# Patient Record
Sex: Female | Born: 1980 | Race: Asian | Hispanic: No | Marital: Married | Smoking: Never smoker
Health system: Southern US, Community
[De-identification: ages and names within clinical notes are randomized; demographics above are authoritative.]

## PROBLEM LIST (undated history)

## (undated) DIAGNOSIS — E119 Type 2 diabetes mellitus without complications: Secondary | ICD-10-CM

## (undated) HISTORY — DX: Type 2 diabetes mellitus without complications: E11.9

---

## 2006-09-19 ENCOUNTER — Ambulatory Visit: Payer: Self-pay | Admitting: Internal Medicine

## 2007-03-12 ENCOUNTER — Ambulatory Visit: Payer: Self-pay | Admitting: Internal Medicine

## 2007-03-12 LAB — CONVERTED CEMR LAB
Albumin: 4.6 g/dL (ref 3.5–5.2)
BUN: 10 mg/dL (ref 6–23)
Calcium: 9.6 mg/dL (ref 8.4–10.5)
Chlamydia, DNA Probe: NEGATIVE
Chloride: 104 meq/L (ref 96–112)
GC Probe Amp, Genital: NEGATIVE
Glucose, Bld: 95 mg/dL (ref 70–99)
Hgb A: 97.5 % (ref 96.8–97.8)
Lymphocytes Relative: 35 % (ref 12–46)
Lymphs Abs: 1.8 10*3/uL (ref 0.7–4.0)
Monocytes Relative: 6 % (ref 3–12)
Neutro Abs: 2.8 10*3/uL (ref 1.7–7.7)
Neutrophils Relative %: 55 % (ref 43–77)
Potassium: 4.3 meq/L (ref 3.5–5.3)
Progesterone: 1 ng/mL
RBC: 4.8 M/uL (ref 3.87–5.11)
Sodium: 139 meq/L (ref 135–145)
Total Protein: 7.7 g/dL (ref 6.0–8.3)
WBC: 5.1 10*3/uL (ref 4.0–10.5)

## 2007-03-13 ENCOUNTER — Ambulatory Visit: Payer: Self-pay | Admitting: *Deleted

## 2007-03-17 ENCOUNTER — Ambulatory Visit: Payer: Self-pay | Admitting: Internal Medicine

## 2007-03-17 LAB — CONVERTED CEMR LAB: Preg, Serum: NEGATIVE

## 2007-04-11 ENCOUNTER — Ambulatory Visit: Payer: Self-pay | Admitting: Internal Medicine

## 2007-09-30 ENCOUNTER — Ambulatory Visit: Payer: Self-pay | Admitting: Advanced Practice Midwife

## 2007-09-30 ENCOUNTER — Emergency Department (HOSPITAL_COMMUNITY): Admission: EM | Admit: 2007-09-30 | Discharge: 2007-09-30 | Payer: Self-pay | Admitting: Family Medicine

## 2007-09-30 ENCOUNTER — Inpatient Hospital Stay (HOSPITAL_COMMUNITY): Admission: AD | Admit: 2007-09-30 | Discharge: 2007-09-30 | Payer: Self-pay | Admitting: Family Medicine

## 2007-10-03 ENCOUNTER — Ambulatory Visit: Payer: Self-pay | Admitting: Family Medicine

## 2007-10-03 ENCOUNTER — Inpatient Hospital Stay (HOSPITAL_COMMUNITY): Admission: AD | Admit: 2007-10-03 | Discharge: 2007-10-13 | Payer: Self-pay | Admitting: Obstetrics and Gynecology

## 2007-10-12 ENCOUNTER — Encounter: Payer: Self-pay | Admitting: Family Medicine

## 2007-10-24 ENCOUNTER — Emergency Department (HOSPITAL_COMMUNITY): Admission: EM | Admit: 2007-10-24 | Discharge: 2007-10-24 | Payer: Self-pay | Admitting: Family Medicine

## 2007-10-25 ENCOUNTER — Ambulatory Visit: Payer: Self-pay | Admitting: Obstetrics & Gynecology

## 2007-10-25 ENCOUNTER — Inpatient Hospital Stay (HOSPITAL_COMMUNITY): Admission: AD | Admit: 2007-10-25 | Discharge: 2007-10-25 | Payer: Self-pay | Admitting: Family Medicine

## 2007-11-07 ENCOUNTER — Ambulatory Visit: Payer: Self-pay | Admitting: Obstetrics & Gynecology

## 2007-11-17 ENCOUNTER — Ambulatory Visit (HOSPITAL_COMMUNITY): Admission: RE | Admit: 2007-11-17 | Discharge: 2007-11-17 | Payer: Self-pay | Admitting: Family Medicine

## 2007-11-29 ENCOUNTER — Emergency Department (HOSPITAL_COMMUNITY): Admission: EM | Admit: 2007-11-29 | Discharge: 2007-11-29 | Payer: Self-pay | Admitting: Emergency Medicine

## 2007-12-31 ENCOUNTER — Encounter: Payer: Self-pay | Admitting: Family Medicine

## 2007-12-31 ENCOUNTER — Ambulatory Visit: Payer: Self-pay | Admitting: Family Medicine

## 2008-02-18 ENCOUNTER — Encounter (INDEPENDENT_AMBULATORY_CARE_PROVIDER_SITE_OTHER): Payer: Self-pay | Admitting: Internal Medicine

## 2008-02-18 ENCOUNTER — Ambulatory Visit: Payer: Self-pay | Admitting: Family Medicine

## 2008-02-18 LAB — CONVERTED CEMR LAB
Ketones, ur: NEGATIVE mg/dL
Nitrite: NEGATIVE
Specific Gravity, Urine: 1.013 (ref 1.005–1.03)
Urobilinogen, UA: 0.2 (ref 0.0–1.0)
pH: 5.5 (ref 5.0–8.0)

## 2008-04-26 ENCOUNTER — Encounter: Payer: Self-pay | Admitting: Family Medicine

## 2008-04-26 ENCOUNTER — Ambulatory Visit: Payer: Self-pay | Admitting: Internal Medicine

## 2010-03-13 ENCOUNTER — Encounter: Payer: Self-pay | Admitting: Emergency Medicine

## 2010-03-21 IMAGING — US US OB LIMITED
1 series · 5 of 5 positions shown · non-contrast
Comparison: none

OBSTETRICAL ULTRASOUND:
 This ultrasound exam was performed in the [HOSPITAL] Ultrasound Department.  The OB US report was generated in the AS system, and faxed to the ordering physician.  This report is also available in [REDACTED] PACS.

[Series 1: us ob limited · 0.30mm/px · 5 of 5 slices shown]
[im 1/5]
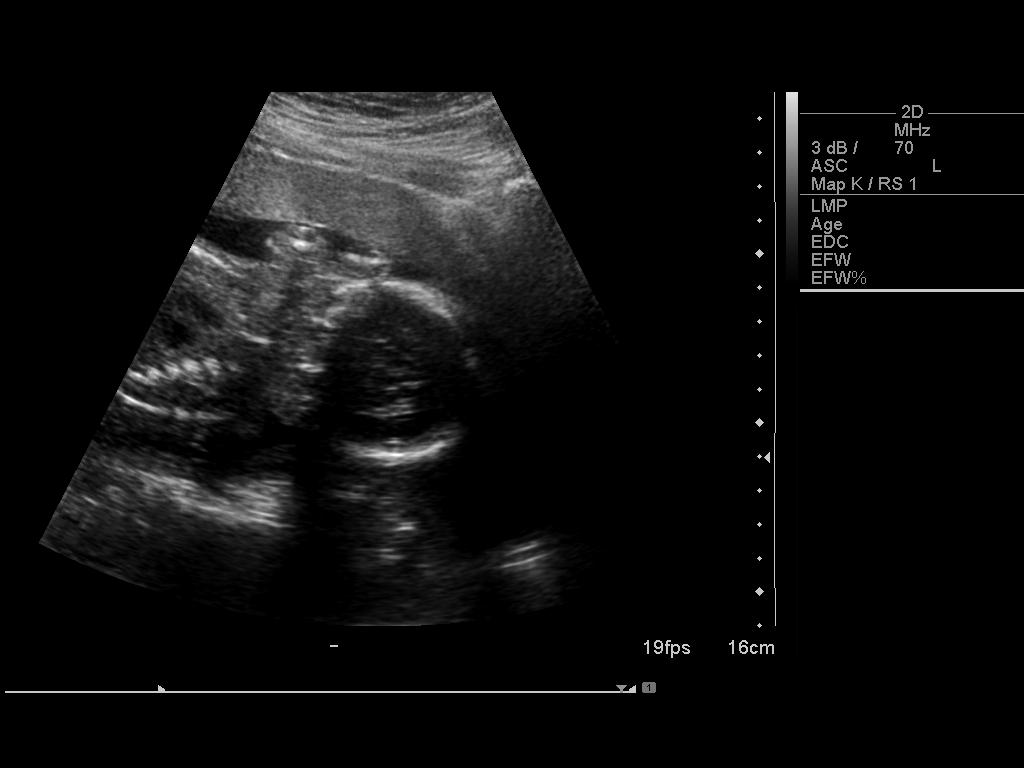
[im 2/5]
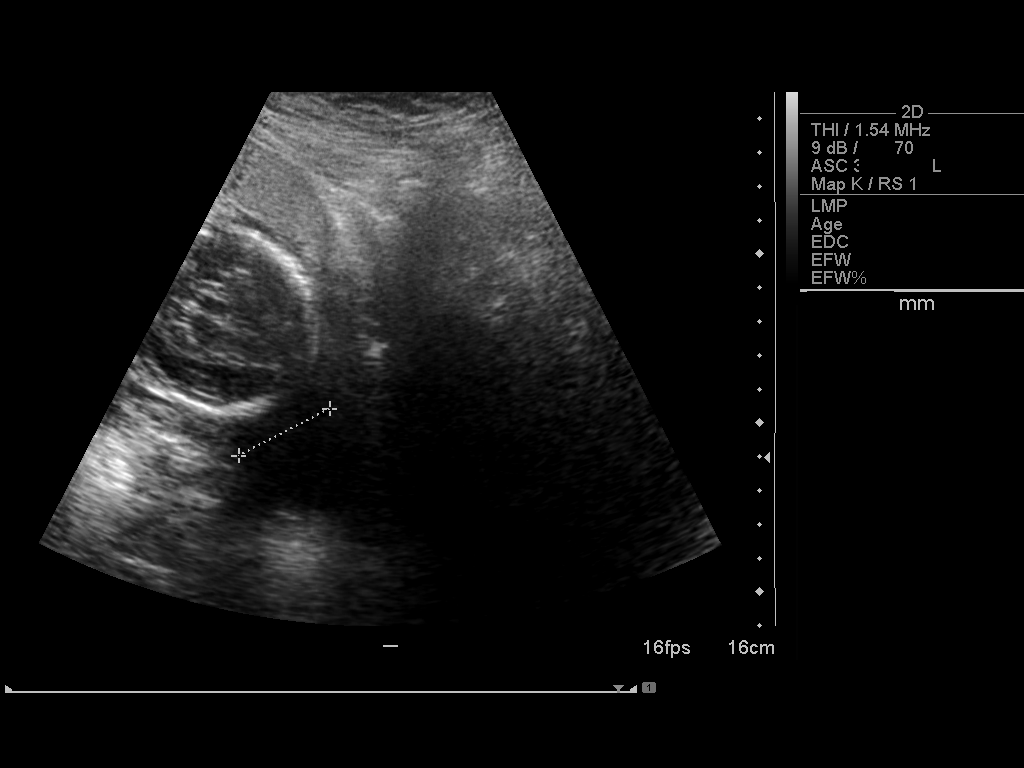
[im 3/5]
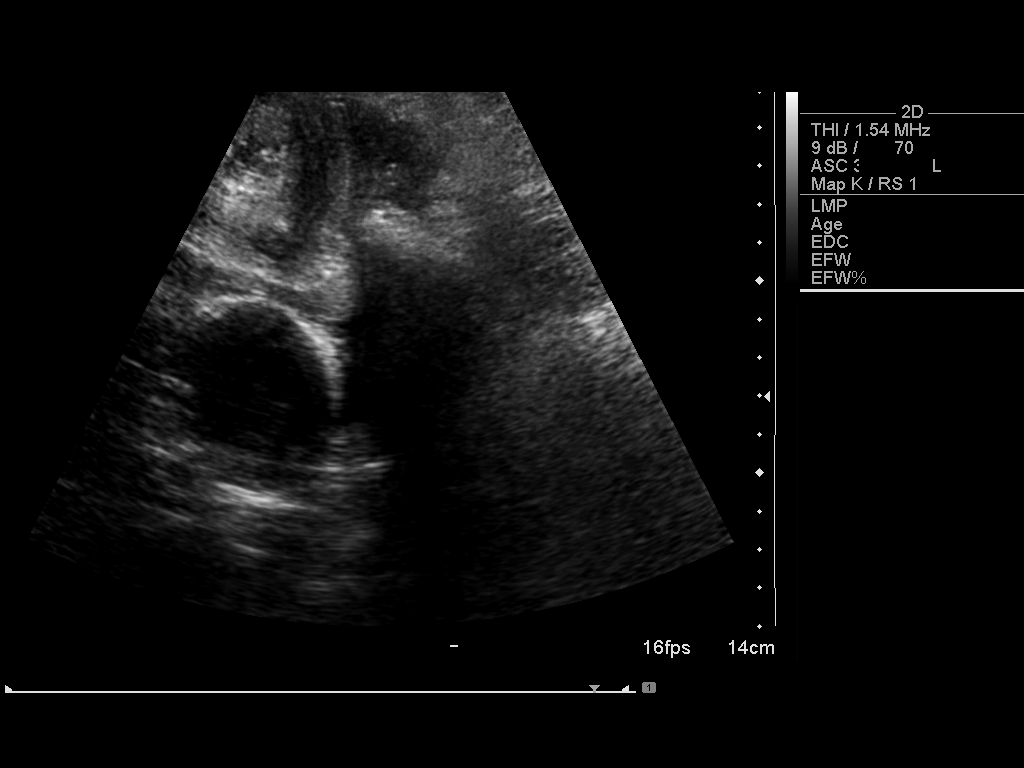
[im 4/5]
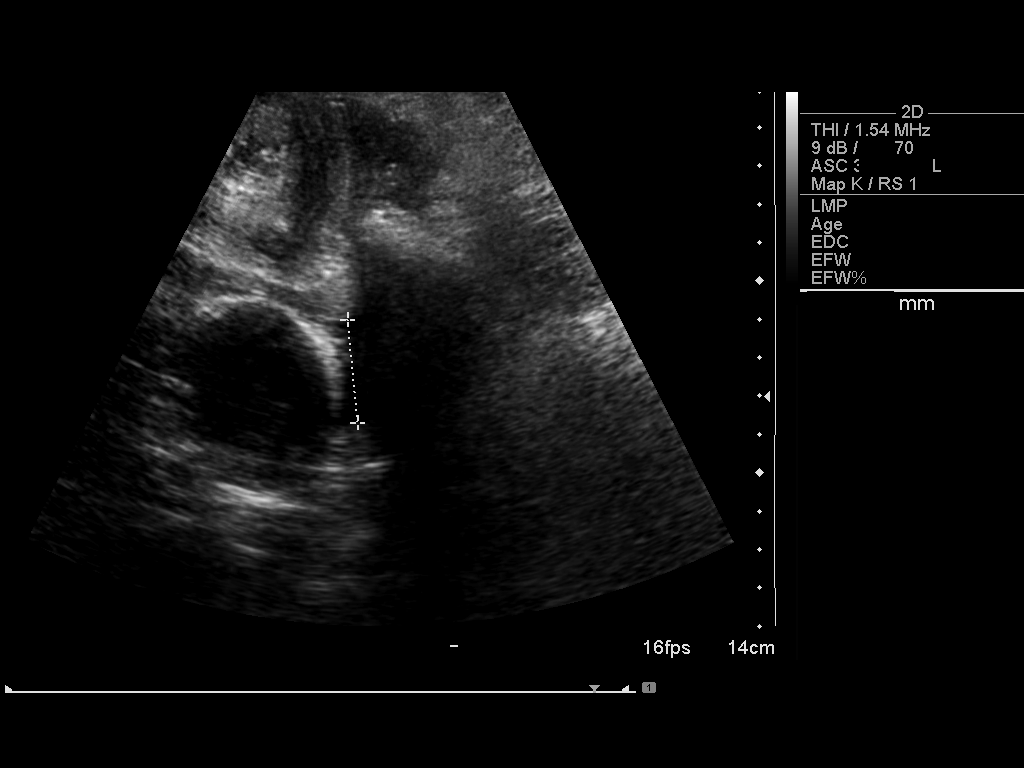
[im 5/5]
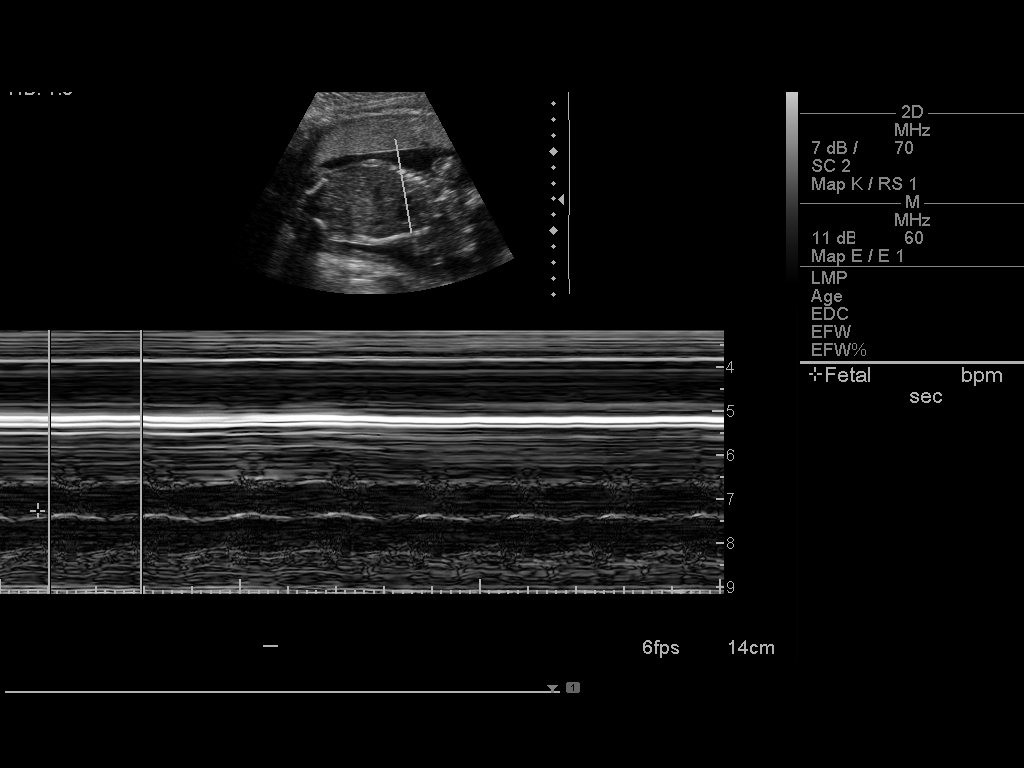

[5 of 5 positions shown; findings below may reference images not displayed]

IMPRESSION: See AS Obstetric US report.

## 2010-04-09 IMAGING — US US TRANSVAGINAL NON-OB
1 series · 14 of 25 positions shown · non-contrast
Comparison: This study is correlated with the transabdominal scan
performed earlier today.

CLINICAL DATA: Pelvic pain.  The patient returned for endovaginal
imaging after the transabdominal scan.

TRANSVAGINAL ULTRASOUND OF PELVIS
TECHNIQUE: Transvaginal ultrasound examination of the pelvis was
performed including evaluation of the uterus, ovaries, adnexal
regions, and pelvic cul-de-sac.

[Series 1: unknown · 0.32mm/px · 14 of 38 slices shown]
[im 1/38]
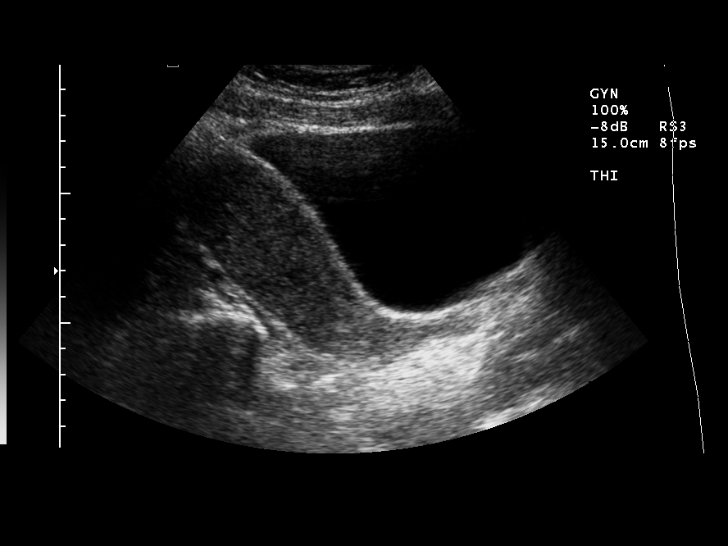
[im 4/38]
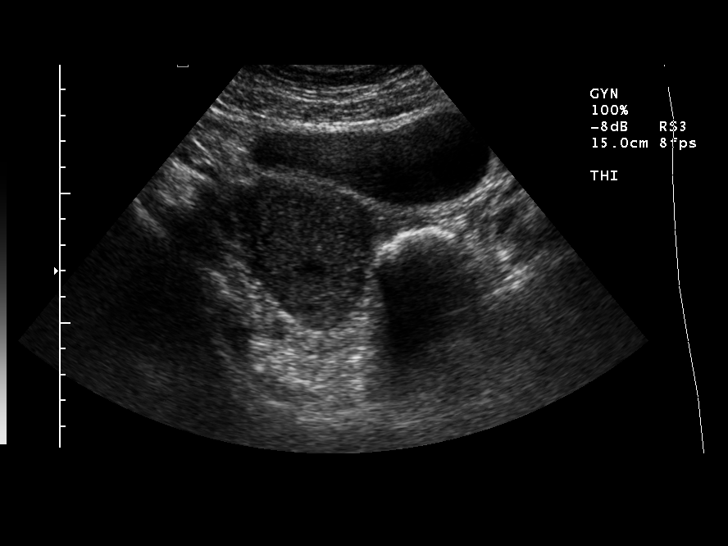
[im 7/38]
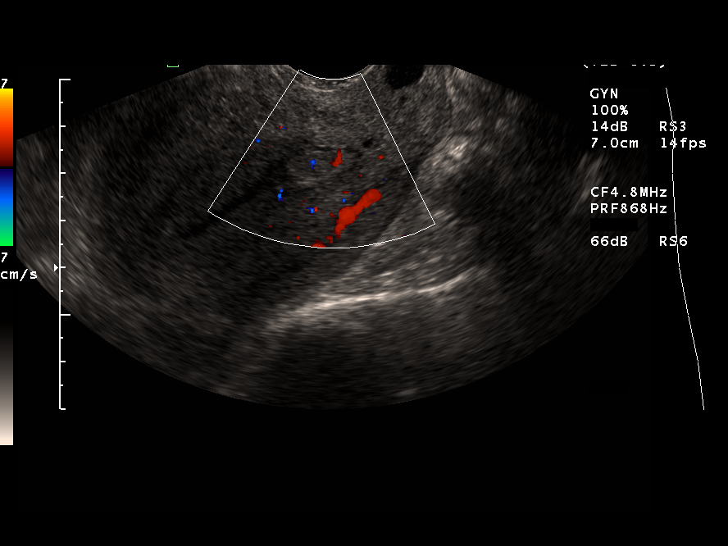
[im 10/38]
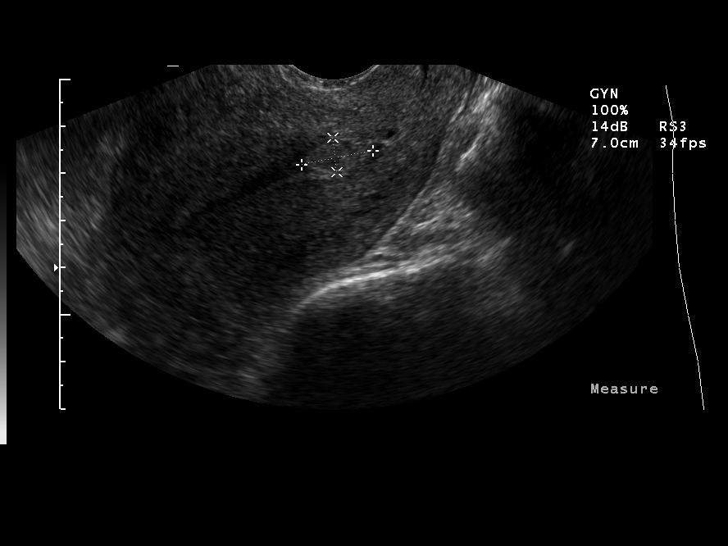
[im 13/38]
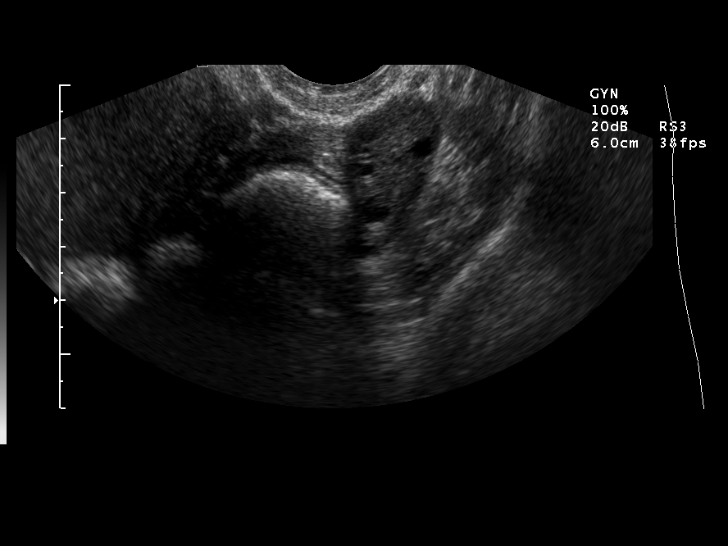
[im 14/38]
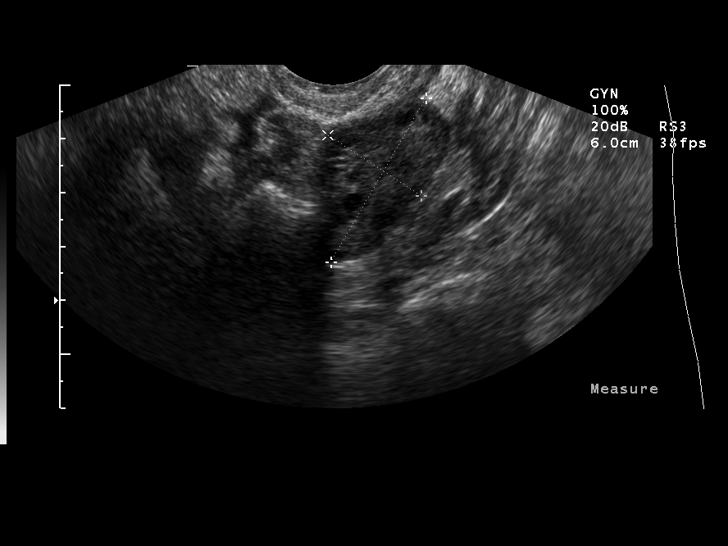
[im 17/38]
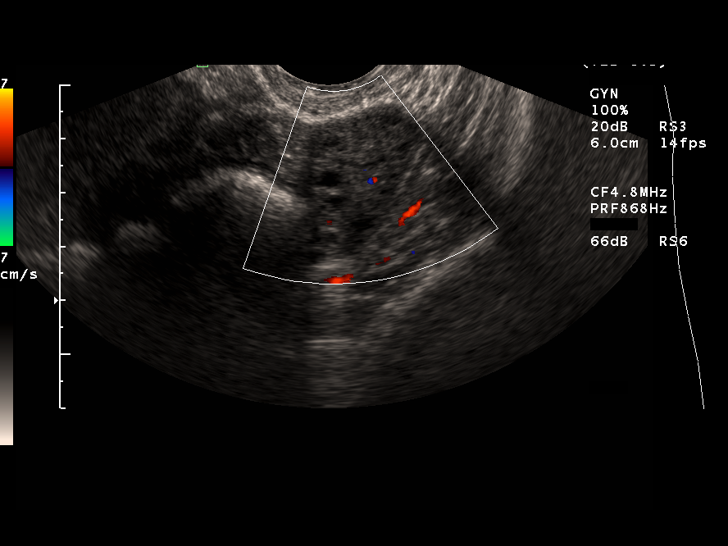
[im 21/38]
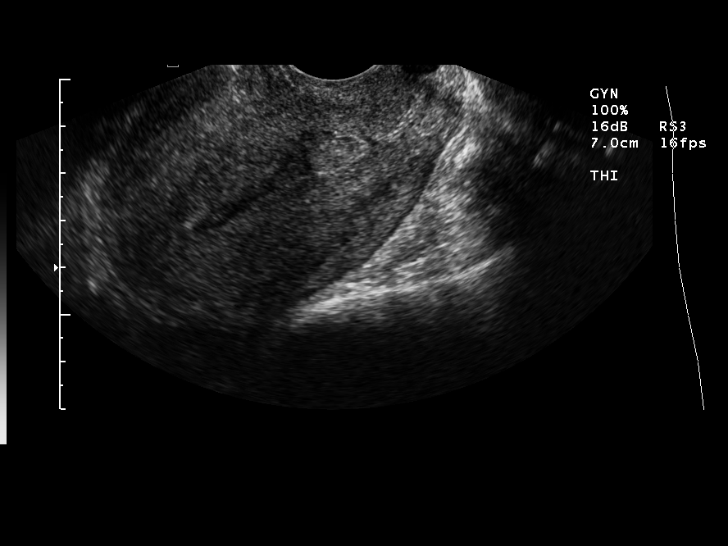
[im 24/38]
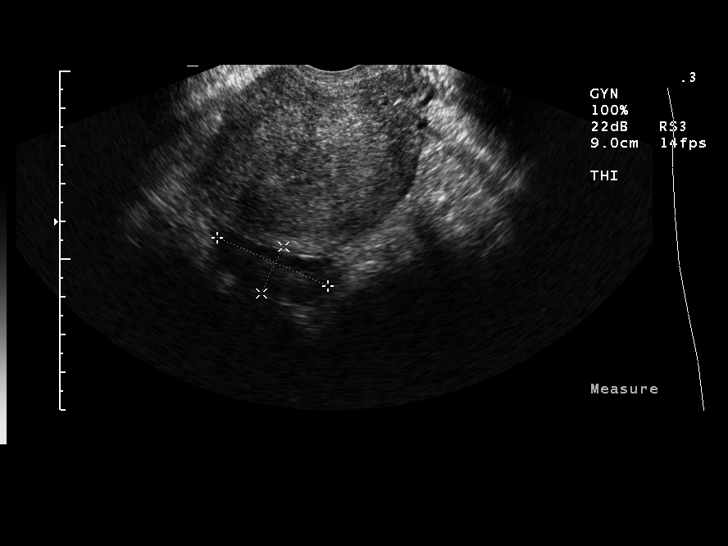
[im 25/38]
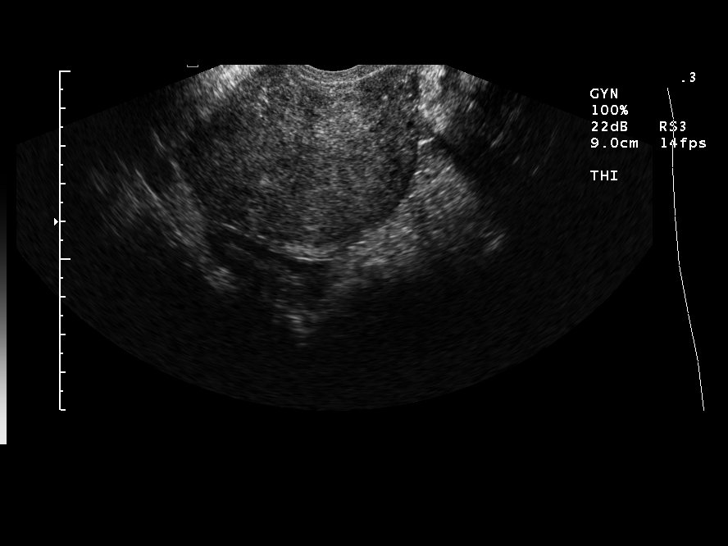
[im 28/38]
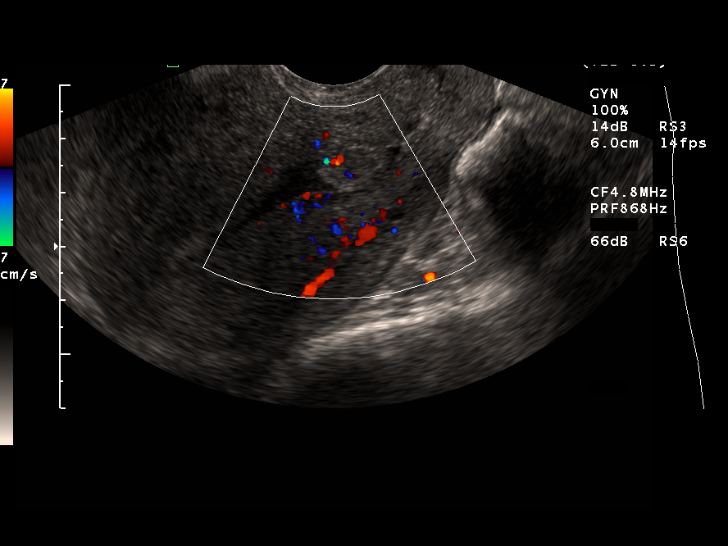
[im 31/38]
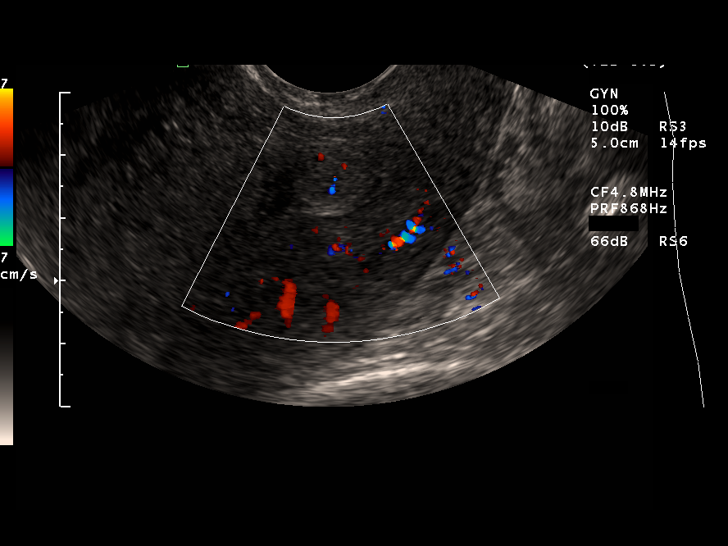
[im 34/38]
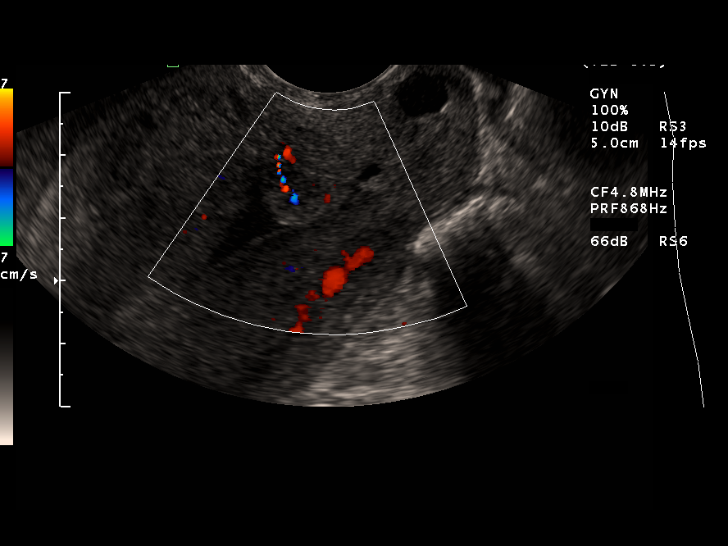
[im 38/38]
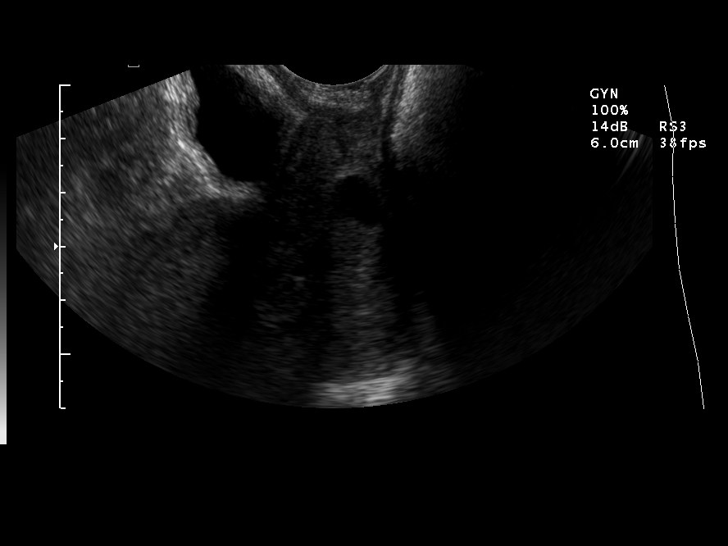

[14 of 25 positions shown; findings below may reference images not displayed]

FINDINGS: On transabdominal imaging, within the endometrial canal,
there is a 7 x 15 x 11 mm echogenic structure in the lower uterine
segment.  Color flow imaging demonstrates positive vascular flow
centrally within the small mass.  These findings are worrisome for
retained products of conception given recent postpartum state.
There is fluid in the mid and upper uterine endometrium and it is
likely that this small mass is causing some obstruction of flow of
fluid out of the uterus.
IMPRESSION: Findings are worrisome for retained products of conception in the
lower uterine segment as described. Critical test results
telephoned to Dr. Oayala at the time of interpretation on date
10/25/2007 at time 6046 hours.

## 2010-04-09 IMAGING — US US PELVIS COMPLETE
1 series · 14 of 25 positions shown · non-contrast
Comparison: 10/08/2007

CLINICAL DATA: Abdominal pain.  2 weeks postpartum.

TRANSABDOMINAL ULTRASOUND OF PELVIS
TECHNIQUE: Transabdominal ultrasound examination of the pelvis was
performed including evaluation of the uterus, ovaries, adnexal
regions, and pelvic cul-de-sac.

[Series 1: unknown · 0.28mm/px · 14 of 32 slices shown]
[im 1/32]
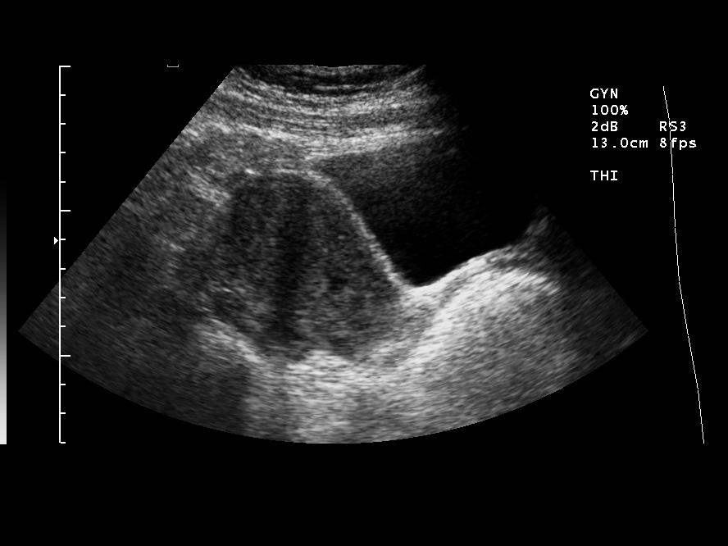
[im 3/32]
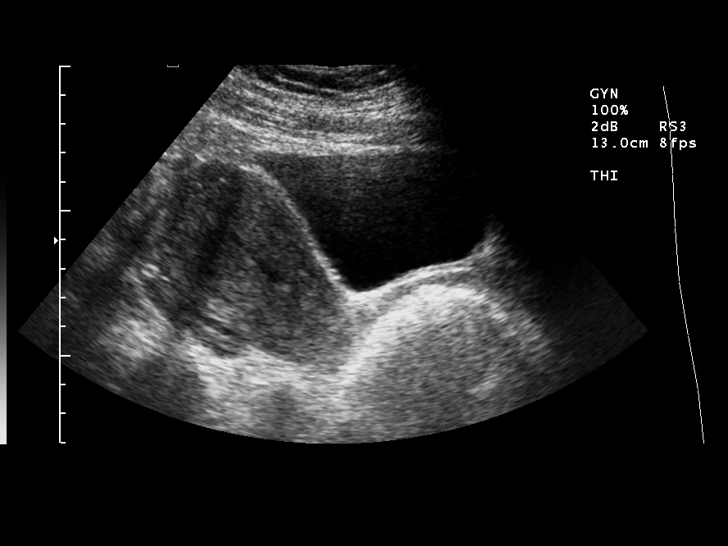
[im 6/32]
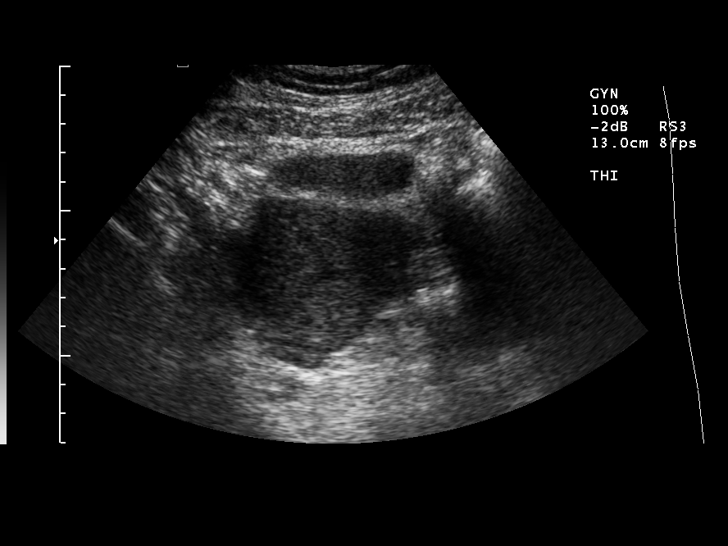
[im 8/32]
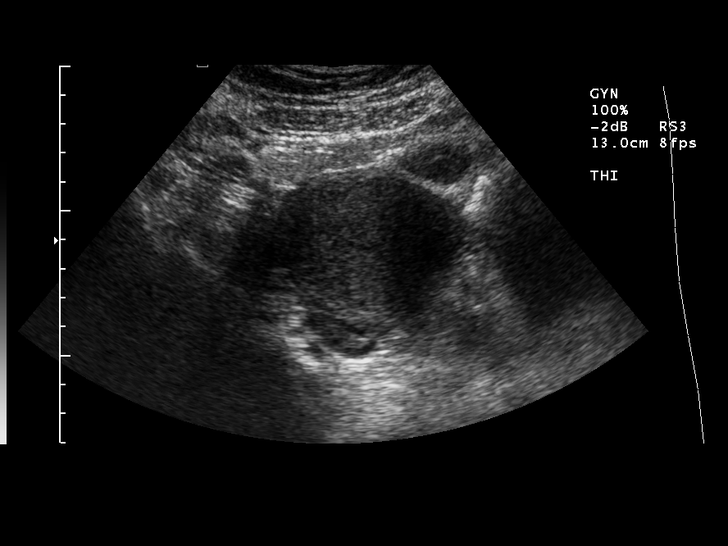
[im 11/32]
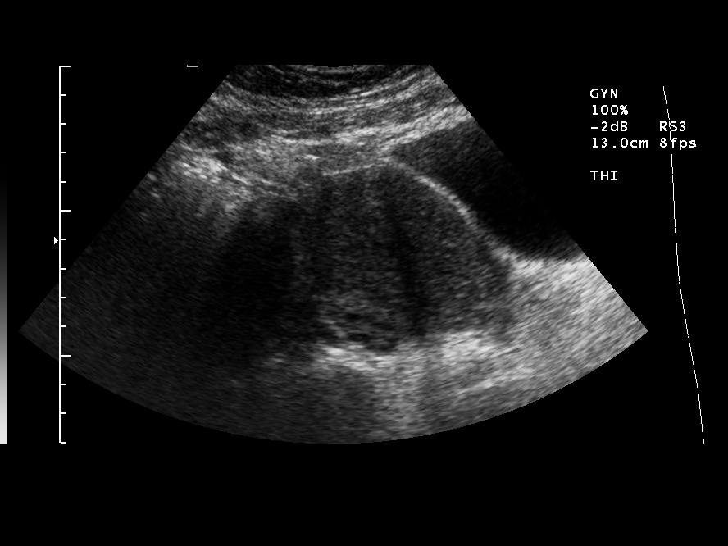
[im 12/32]
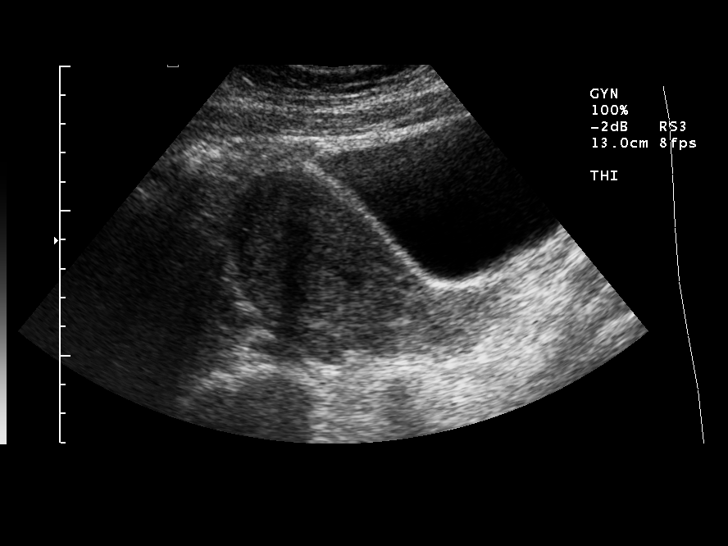
[im 15/32]
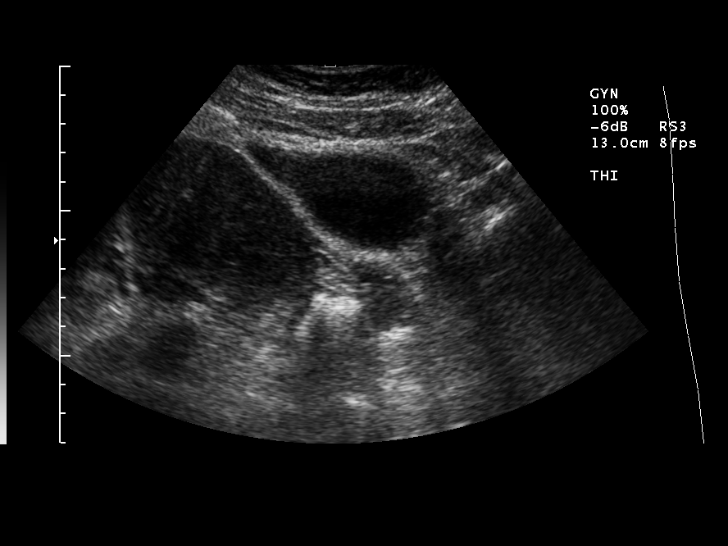
[im 17/32]
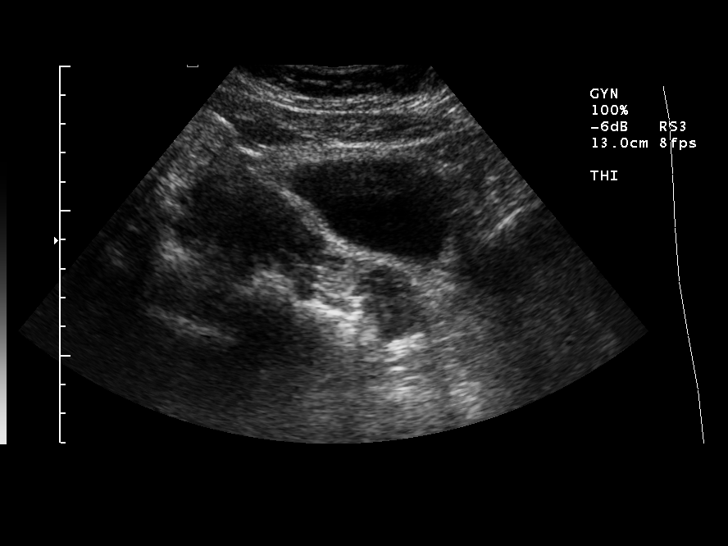
[im 20/32]
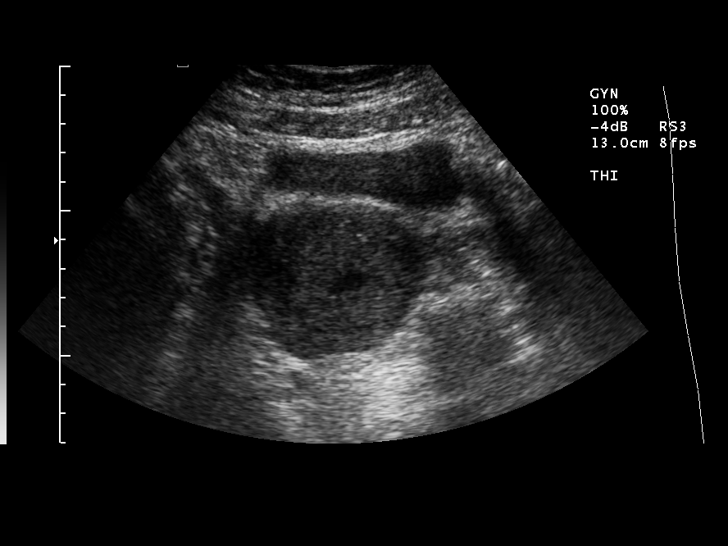
[im 21/32]
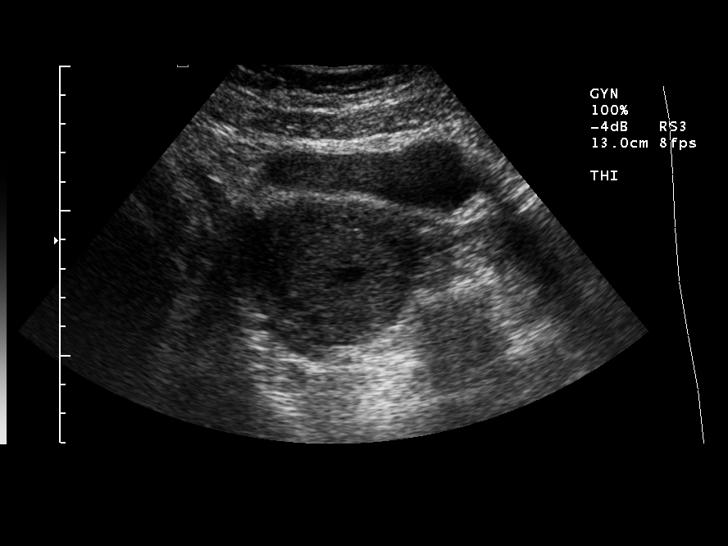
[im 24/32]
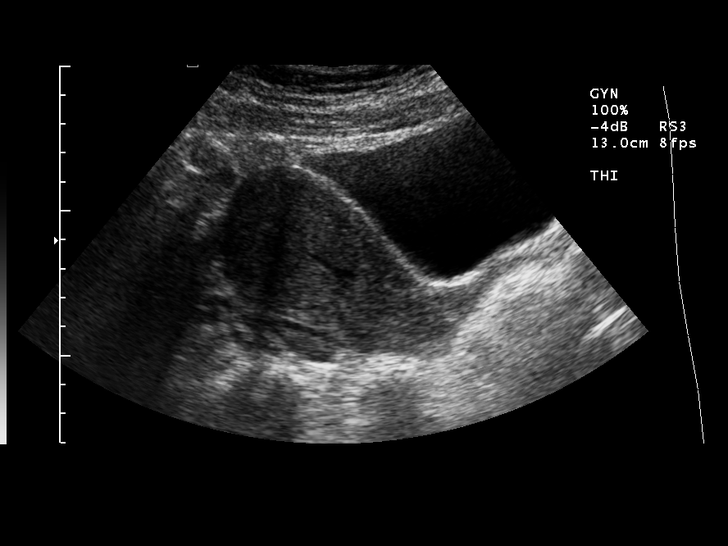
[im 26/32]
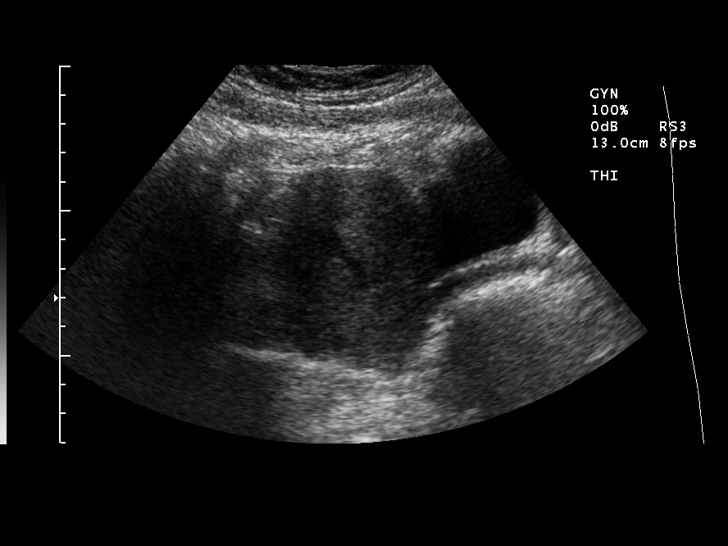
[im 29/32]
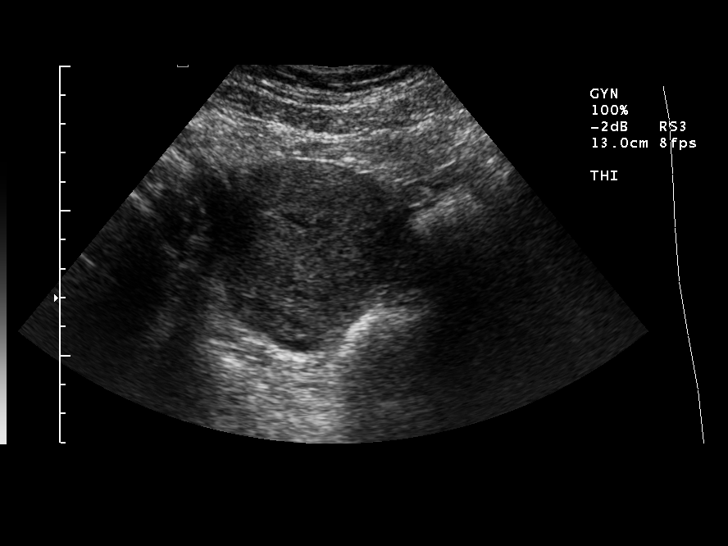
[im 32/32]
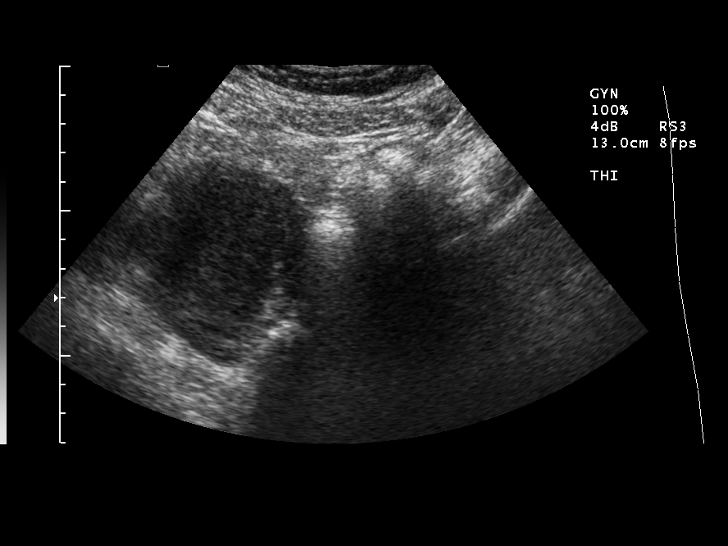

[14 of 25 positions shown; findings below may reference images not displayed]

FINDINGS: The patient refused endovaginal imaging.  This does
severely limit the exam.

The uterus is 7.4 x 4.7 x 5.1 cm.  The endometrial region is 7 mm
in thickness and primarily anechoic most consistent with fluid.
There is no obvious endometrial mass.  Ovaries are within normal
limits.  No free fluid.
IMPRESSION: A small amount of fluid is noted in the endometrium.  Otherwise no
acute abnormality.  The patient refused transvaginal sonography and
this limits the exam

## 2010-07-04 NOTE — Group Therapy Note (Signed)
Brianna Avery NO.:  [id number]   MEDICAL RECORD NO.:  [id number]          PATIENT TYPE:  WOC   LOCATION:  [facility]                   FACILITY:  [facility]   PHYSICIAN:  Elsie Lincoln, MD      DATE OF BIRTH:  1980/12/03   DATE OF SERVICE:                                  CLINIC NOTE   TOTAL TIME SPENT WITH THE PATIENT:  One hour.   Patient is a 30 year old female who was admitted to our service in  August 2009.  She had symptoms of an incompetent cervix and had her  membranes dumbelling into the vagina for almost a week.  She had very  good dating for her pregnancy.  She eventually developed  chorioamnionitis and delivered.  It was a very interesting delivery in  that she would not open her legs and the baby stayed in the vagina for  up to 2 hours.  The patient has not coped very well.  She seems sad and  very tense and the trauma has I think caused her to have vaginosis  because putting a speculum is impossible and you can barely admit 1  finger into her vagina.  MFM was involved with her care with consults  and I think they agree with the diagnosis of incompetent cervix;  however, they would like to see and MFM prior to becoming regnant again  and we have referred them.  We will also need to refer them to the  [facility].  The patient did come to the emergency  room with some pain and had an ultrasound that showed 15 mm of stripe in  the lower uterine segment that was questionably retained products of  conception.  She was given Methergine and today her urine pregnancy test  is negative and there is no bleeding.  I am assuming that everything has  passed and I explained this to the patient.   PHYSICAL EXAM:  Temperature 98.  Pulse 100.  Blood pressure 102/71.  Weight 160.7.  Height 5 feet 4 inches.  GENERAL:  Well nourished, well developed, seems sad.  GENITALIA:  Tanner 5.  Unable to do a speculum exam.  Very tense pelvic  diaphragm  and spasming of the levators as she tries to allow me to do  the exam.  Her cervix is closed.  Her uterus is nontender and normal  size.  Adnexa no masses, nontender.   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  1. MFM consult.  2. Kegel exercises to help control some of the spasming of her pelvic      diaphragm.  3. EPT today is negative.  4. Patient can start to conceive after another cycle.  5. Call us p.r.n.           ______________________________  Elsie Lincoln, MD     KL/MEDQ  D:  11/07/2007  T:  11/10/2007  Job:  [id number]

## 2010-07-04 NOTE — Discharge Summary (Signed)
NAMEKARIANN, WECKER NO.:  [id number]   MEDICAL RECORD NO.:  [id number]          PATIENT TYPE:  INP   LOCATION:  9304                          [facility]   PHYSICIAN:  Tilda Burrow, M.D. DATE OF BIRTH:  [birth year]   DATE OF ADMISSION:  10/03/2007  DATE OF DISCHARGE:  10/13/2007                               DISCHARGE SUMMARY   ADMITTING DIAGNOSES:  1. Pregnancy, 22 weeks' gestation.  2. Premature cervical dilation, suspected incompetent cervix,      threatened miscarriage.   HISTORY OF PRESENT ILLNESS:  This is a 30 year old female gravida 1,  para 0-0-0-0 is admitted after a pregnancy course, initially started at  the clinic in [place], with recent initiation of care here in the [place].  Gestational age is based on ultrasound obtained from early in  the pregnancy by her [place] physicians.  She is 22 weeks by best  criteria.  She was presented here complaining of mild-to-moderate  intensity cramping located in lower abdomen, pelvis, and groin.  She has  had passed a bloody mucus-tinged mucus vaginally and she had not seen  anybody here in the [place] for prenatal care.  She had been  evaluated at [facility] on September 30, 2007,  and diagnosed of having urinary tract infection and treated accordingly.  Cervix was described as long and thick at the time of that evaluation.  Urine culture came back positive for group B strep.  Examination on time  of admission, October 03, 2007, showed the cervix to be 3.5 cm, 50% with  bulging allograft membranes into the introitus.  The patient requested  that no female providers to be involved in her care and those requests  were honored.  The patient was, therefore, admitted for management of  the cervical incompetency.  Sterile speculum exam on October 04, 2007, by  Dr. Penne Lash showed bag of waters bulging 2-3 cm from the cervical os  with pooling and amniotic fluids suspected.   Ferning was negative at the  time of that exam.  The patient selected conservative care.  She was  made aware of the risk of infection and potential jeopardy to her  health.  Subsequent questions on the patient regarding gestational age  were addressed by review of her old ultrasound.  On October 06, 2007, she  had exquisite left lower quadrant tenderness, but still desired  conservative management.  White count was 12,500 and she was on  antibiotic prophylaxis.  She had increased leakage of fluid on October 06, 2007, to clarify whether membranes were ruptured.  The ultrasound  showed oligohydramnios with the cervical head at the os and allografting  membranes below well into the vaginal vault.  The patient still offered  augmentation, though she was offered augmentation of labor to effect  delivery.  Extreme prematurity and the nonviability of the infant was  discussed at length with the patient.  Pediatric counseling regarding  the nonviability of the premature infant was performed.  On October 07, 2007, the patient experienced continued cramping of rectal pressure.  She initially agreed to low-dose Pitocin to evacuate the uterus and then  changed her mind.  She desired no further intervention and was  transferred to the floor for observation.  Consideration was given  sending the patient home until viable gestational age and the patient  insisted on being managed in the hospital.  Repeat conversations were  held on October 07, 2007, regarding the patient's uncertainty regarding  the dates.  We have again re-iterated our available information and the  minimal likelihood of the fetal viability at this time.  On October 08, 2007, October 09, 2007, and October 10, 2007, things were made positive  with no significant change in her status.  White count remained stable  at 13,800.  The patient was afebrile.  On October 11, 2007, she began to  experience an increased cramping and foul-smelling  discharge.  Vaginal  exam showed small parts in the vagina with the diagnosis of  chorioamnionitis confirmed along with the preterm prolonged rupture of  membranes.  Maternal Fetal Medicine consult to confirm that all avenues  of support had been pursued, was performed and documented.  On October 11, 2007, at 23:50, the patient was informed that the head had made  progress for delivery.  On October 12, 2007, the patient suddenly felt  baby coming out with feet to the introitus.  She delivered over 8 pounds  viable female infant with malodor indicative of infection.  The infant  was alive at birth but expired thereafter.  The EBL was 150 mL.  The  placenta delivered spontaneously.  Postpartum course was uneventful.  Blood type was confirmed as B positive.  She was then discharged home on  October 13, 2007, in stable condition for postpartum care through the  health department.      Tilda Burrow, M.D.  Electronically Signed     JVF/MEDQ  D:  11/02/2007  T:  11/03/2007  Job:  [id number]

## 2010-11-17 LAB — CBC
MCV: 87.7
Platelets: 284
RBC: 3.96
WBC: 13.5 — ABNORMAL HIGH

## 2010-11-17 LAB — WET PREP, GENITAL
Clue Cells Wet Prep HPF POC: NONE SEEN
Trich, Wet Prep: NONE SEEN
Yeast Wet Prep HPF POC: NONE SEEN

## 2010-11-17 LAB — URINALYSIS, ROUTINE W REFLEX MICROSCOPIC
Bilirubin Urine: NEGATIVE
Glucose, UA: NEGATIVE
Hgb urine dipstick: NEGATIVE
Ketones, ur: NEGATIVE
Protein, ur: NEGATIVE
pH: 6

## 2010-11-17 LAB — URINE CULTURE

## 2010-11-17 LAB — DIFFERENTIAL
Eosinophils Absolute: 0.1
Lymphs Abs: 2
Monocytes Relative: 5
Neutro Abs: 10.8 — ABNORMAL HIGH
Neutrophils Relative %: 80 — ABNORMAL HIGH

## 2010-11-17 LAB — POCT URINALYSIS DIP (DEVICE)
Bilirubin Urine: NEGATIVE
Glucose, UA: NEGATIVE
Hgb urine dipstick: NEGATIVE
Nitrite: NEGATIVE
Operator id: 29721
Urobilinogen, UA: 0.2

## 2010-11-17 LAB — RPR: RPR Ser Ql: NONREACTIVE

## 2010-11-17 LAB — RUBELLA SCREEN: Rubella: 235.8 — ABNORMAL HIGH

## 2010-11-17 LAB — HEPATITIS B SURFACE ANTIGEN: Hepatitis B Surface Ag: NEGATIVE

## 2010-11-20 LAB — POCT PREGNANCY, URINE: Preg Test, Ur: NEGATIVE

## 2010-11-21 LAB — POCT URINALYSIS DIP (DEVICE)
Bilirubin Urine: NEGATIVE
Glucose, UA: NEGATIVE
Hgb urine dipstick: NEGATIVE
Ketones, ur: NEGATIVE
Nitrite: NEGATIVE

## 2010-11-21 LAB — POCT PREGNANCY, URINE: Preg Test, Ur: NEGATIVE

## 2010-11-22 LAB — COMPREHENSIVE METABOLIC PANEL
AST: 40 — ABNORMAL HIGH
Albumin: 3.6
CO2: 26
Calcium: 9.7
Creatinine, Ser: 0.71
GFR calc Af Amer: >60
GFR calc non Af Amer: >60
Total Protein: 7.8

## 2010-11-22 LAB — DIFFERENTIAL
Basophils Relative: 1
Eosinophils Absolute: 0
Lymphs Abs: 2
Monocytes Absolute: 0.4
Monocytes Relative: 4
Neutro Abs: 5.8
Neutrophils Relative %: 70

## 2010-11-22 LAB — URINE CULTURE: Colony Count: 75000

## 2010-11-22 LAB — CBC
MCHC: 33.3
MCV: 86.5
Platelets: 336
RDW: 12.8

## 2010-11-22 LAB — POCT URINALYSIS DIP (DEVICE)
Bilirubin Urine: NEGATIVE
Glucose, UA: NEGATIVE
Ketones, ur: NEGATIVE
Nitrite: NEGATIVE

## 2010-11-22 LAB — POCT PREGNANCY, URINE: Preg Test, Ur: NEGATIVE

## 2010-11-22 LAB — URINALYSIS, ROUTINE W REFLEX MICROSCOPIC
Bilirubin Urine: NEGATIVE
Glucose, UA: NEGATIVE
Hgb urine dipstick: NEGATIVE
Specific Gravity, Urine: 1.009

## 2010-11-22 LAB — PROTIME-INR: INR: 1

## 2010-11-22 LAB — GC/CHLAMYDIA PROBE AMP, GENITAL: GC Probe Amp, Genital: NEGATIVE

## 2010-11-22 LAB — APTT: aPTT: 27

## 2010-11-22 LAB — LIPASE, BLOOD: Lipase: 31

## 2016-02-15 ENCOUNTER — Emergency Department (HOSPITAL_BASED_OUTPATIENT_CLINIC_OR_DEPARTMENT_OTHER)
Admission: EM | Admit: 2016-02-15 | Discharge: 2016-02-15 | Disposition: A | Discharge status: Home / Self Care | Payer: Medicaid Other | Attending: Dermatology | Admitting: Dermatology

## 2016-02-15 ENCOUNTER — Encounter (HOSPITAL_BASED_OUTPATIENT_CLINIC_OR_DEPARTMENT_OTHER): Payer: Self-pay | Admitting: *Deleted

## 2016-02-15 DIAGNOSIS — J029 Acute pharyngitis, unspecified: Secondary | ICD-10-CM | POA: Diagnosis not present

## 2016-02-15 DIAGNOSIS — Z5321 Procedure and treatment not carried out due to patient leaving prior to being seen by health care provider: Secondary | ICD-10-CM | POA: Insufficient documentation

## 2016-02-15 NOTE — ED Triage Notes (Signed)
Pt c/o sore throat and h/a x 1 day , kids at home with strep

## 2016-02-15 NOTE — ED Notes (Signed)
Patient stated that he had to leave unless we could take him and his wife back immediately. Informed him that we would get them to a room as soon as possible, but could not take them back immediately. They stated that they may come back later tonight. Patient alert and orient VSS. 

## 2022-11-02 ENCOUNTER — Encounter (HOSPITAL_BASED_OUTPATIENT_CLINIC_OR_DEPARTMENT_OTHER): Payer: Self-pay | Admitting: Emergency Medicine

## 2022-11-02 ENCOUNTER — Emergency Department (HOSPITAL_BASED_OUTPATIENT_CLINIC_OR_DEPARTMENT_OTHER)
Admission: EM | Admit: 2022-11-02 | Discharge: 2022-11-02 | Discharge status: Against Medical Advice | Payer: Medicaid Other | Attending: Emergency Medicine | Admitting: Emergency Medicine

## 2022-11-02 ENCOUNTER — Emergency Department (HOSPITAL_BASED_OUTPATIENT_CLINIC_OR_DEPARTMENT_OTHER): Payer: Medicaid Other

## 2022-11-02 ENCOUNTER — Other Ambulatory Visit: Payer: Self-pay

## 2022-11-02 DIAGNOSIS — R0602 Shortness of breath: Secondary | ICD-10-CM | POA: Insufficient documentation

## 2022-11-02 DIAGNOSIS — R000 Tachycardia, unspecified: Secondary | ICD-10-CM | POA: Diagnosis not present

## 2022-11-02 DIAGNOSIS — R197 Diarrhea, unspecified: Secondary | ICD-10-CM | POA: Diagnosis not present

## 2022-11-02 DIAGNOSIS — Z20822 Contact with and (suspected) exposure to covid-19: Secondary | ICD-10-CM | POA: Insufficient documentation

## 2022-11-02 DIAGNOSIS — E876 Hypokalemia: Secondary | ICD-10-CM | POA: Insufficient documentation

## 2022-11-02 DIAGNOSIS — R7401 Elevation of levels of liver transaminase levels: Secondary | ICD-10-CM | POA: Diagnosis not present

## 2022-11-02 DIAGNOSIS — R519 Headache, unspecified: Secondary | ICD-10-CM | POA: Insufficient documentation

## 2022-11-02 DIAGNOSIS — Z5329 Procedure and treatment not carried out because of patient's decision for other reasons: Secondary | ICD-10-CM | POA: Diagnosis not present

## 2022-11-02 DIAGNOSIS — R509 Fever, unspecified: Secondary | ICD-10-CM | POA: Diagnosis present

## 2022-11-02 DIAGNOSIS — E1165 Type 2 diabetes mellitus with hyperglycemia: Secondary | ICD-10-CM | POA: Diagnosis not present

## 2022-11-02 DIAGNOSIS — Z7984 Long term (current) use of oral hypoglycemic drugs: Secondary | ICD-10-CM | POA: Diagnosis not present

## 2022-11-02 DIAGNOSIS — E871 Hypo-osmolality and hyponatremia: Secondary | ICD-10-CM | POA: Insufficient documentation

## 2022-11-02 LAB — CBC WITH DIFFERENTIAL/PLATELET
Abs Immature Granulocytes: 0.01 10*3/uL (ref 0.00–0.07)
Basophils Absolute: 0 10*3/uL (ref 0.0–0.1)
Basophils Relative: 0 %
Eosinophils Absolute: 0 10*3/uL (ref 0.0–0.5)
Eosinophils Relative: 0 %
HCT: 36.6 % (ref 36.0–46.0)
Hemoglobin: 11.7 g/dL — ABNORMAL LOW (ref 12.0–15.0)
Immature Granulocytes: 0 %
Lymphocytes Relative: 7 %
Lymphs Abs: 0.3 10*3/uL — ABNORMAL LOW (ref 0.7–4.0)
MCH: 25.5 pg — ABNORMAL LOW (ref 26.0–34.0)
MCHC: 32 g/dL (ref 30.0–36.0)
MCV: 79.9 fL — ABNORMAL LOW (ref 80.0–100.0)
Monocytes Absolute: 0.2 10*3/uL (ref 0.1–1.0)
Monocytes Relative: 4 %
Neutro Abs: 3.8 10*3/uL (ref 1.7–7.7)
Neutrophils Relative %: 89 %
Platelets: 206 10*3/uL (ref 150–400)
RBC: 4.58 MIL/uL (ref 3.87–5.11)
RDW: 15.2 % (ref 11.5–15.5)
WBC: 4.4 10*3/uL (ref 4.0–10.5)
nRBC: 0 % (ref 0.0–0.2)

## 2022-11-02 LAB — URINALYSIS, W/ REFLEX TO CULTURE (INFECTION SUSPECTED)
Bilirubin Urine: NEGATIVE
Glucose, UA: NEGATIVE mg/dL
Hgb urine dipstick: NEGATIVE
Ketones, ur: NEGATIVE mg/dL
Leukocytes,Ua: NEGATIVE
Nitrite: NEGATIVE
Protein, ur: NEGATIVE mg/dL
Specific Gravity, Urine: 1.01 (ref 1.005–1.030)
pH: 6 (ref 5.0–8.0)

## 2022-11-02 LAB — HEPATITIS PANEL, ACUTE
HCV Ab: NONREACTIVE
Hep A IgM: NONREACTIVE
Hep B C IgM: NONREACTIVE
Hepatitis B Surface Ag: NONREACTIVE

## 2022-11-02 LAB — RESP PANEL BY RT-PCR (RSV, FLU A&B, COVID)  RVPGX2
Influenza A by PCR: NEGATIVE
Influenza B by PCR: NEGATIVE
Resp Syncytial Virus by PCR: NEGATIVE
SARS Coronavirus 2 by RT PCR: NEGATIVE

## 2022-11-02 LAB — COMPREHENSIVE METABOLIC PANEL
ALT: 261 U/L — ABNORMAL HIGH (ref 0–44)
AST: 316 U/L — ABNORMAL HIGH (ref 15–41)
Albumin: 3.9 g/dL (ref 3.5–5.0)
Alkaline Phosphatase: 101 U/L (ref 38–126)
Anion gap: 7 (ref 5–15)
BUN: 8 mg/dL (ref 6–20)
CO2: 22 mmol/L (ref 22–32)
Calcium: 8.4 mg/dL — ABNORMAL LOW (ref 8.9–10.3)
Chloride: 103 mmol/L (ref 98–111)
Creatinine, Ser: 0.89 mg/dL (ref 0.44–1.00)
GFR, Estimated: >60 mL/min (ref >60)
Glucose, Bld: 148 mg/dL — ABNORMAL HIGH (ref 70–99)
Potassium: 3.4 mmol/L — ABNORMAL LOW (ref 3.5–5.1)
Sodium: 132 mmol/L — ABNORMAL LOW (ref 135–145)
Total Bilirubin: 0.6 mg/dL (ref 0.3–1.2)
Total Protein: 7.5 g/dL (ref 6.5–8.1)

## 2022-11-02 LAB — TROPONIN I (HIGH SENSITIVITY): Troponin I (High Sensitivity): 2 ng/L (ref <18)

## 2022-11-02 LAB — APTT: aPTT: 27 s (ref 24–36)

## 2022-11-02 LAB — LACTIC ACID, PLASMA
Lactic Acid, Venous: 2.1 mmol/L (ref 0.5–1.9)
Lactic Acid, Venous: 1.1 mmol/L (ref 0.5–1.9)

## 2022-11-02 LAB — PREGNANCY, URINE: Preg Test, Ur: NEGATIVE

## 2022-11-02 LAB — PROTIME-INR
INR: 1.2 (ref 0.8–1.2)
Prothrombin Time: 15.4 s — ABNORMAL HIGH (ref 11.4–15.2)

## 2022-11-02 LAB — PARASITE EXAM SCREEN, BLOOD-W CONF TO LABCORP (NOT @ ARMC)

## 2022-11-02 LAB — LIPASE, BLOOD: Lipase: 40 U/L (ref 11–51)

## 2022-11-02 LAB — MONONUCLEOSIS SCREEN: Mono Screen: NEGATIVE

## 2022-11-02 LAB — TSH: TSH: 0.757 u[IU]/mL (ref 0.350–4.500)

## 2022-11-02 MED ORDER — LORAZEPAM 2 MG/ML IJ SOLN: 0.5000 mg | Freq: Once | INTRAMUSCULAR | Status: DC

## 2022-11-02 MED ORDER — ACETAMINOPHEN 650 MG RE SUPP
650.0000 mg | Freq: Once | RECTAL | Status: AC
  Administered 2022-11-02: 650 mg via RECTAL
  Filled 2022-11-02: qty 1

## 2022-11-02 MED ORDER — METOCLOPRAMIDE HCL 5 MG/ML IJ SOLN
10.0000 mg | Freq: Once | INTRAMUSCULAR | Status: AC
  Administered 2022-11-02: 10 mg via INTRAVENOUS
  Filled 2022-11-02: qty 2

## 2022-11-02 MED ORDER — SODIUM CHLORIDE 0.9 % IV SOLN
1.0000 g | Freq: Once | INTRAVENOUS | Status: AC
  Administered 2022-11-02: 1 g via INTRAVENOUS
  Filled 2022-11-02: qty 10

## 2022-11-02 MED ORDER — LACTATED RINGERS IV BOLUS
1000.0000 mL | Freq: Once | INTRAVENOUS | Status: AC
  Administered 2022-11-02: 1000 mL via INTRAVENOUS

## 2022-11-02 MED ORDER — IBUPROFEN 400 MG PO TABS: 600.0000 mg | ORAL_TABLET | Freq: Once | ORAL | Status: DC

## 2022-11-02 MED ORDER — ONDANSETRON HCL 4 MG/2ML IJ SOLN
4.0000 mg | Freq: Once | INTRAMUSCULAR | Status: AC
  Administered 2022-11-02: 4 mg via INTRAVENOUS
  Filled 2022-11-02: qty 2

## 2022-11-02 MED ORDER — IOHEXOL 350 MG/ML SOLN
100.0000 mL | Freq: Once | INTRAVENOUS | Status: AC | PRN
  Administered 2022-11-02: 100 mL via INTRAVENOUS

## 2022-11-02 NOTE — ED Notes (Signed)
MD made aware of 2.1 lactic

## 2022-11-02 NOTE — Discharge Instructions (Signed)
Please read and follow all provided instructions.  Your diagnoses today include:  1. Febrile illness, acute   2. Transaminitis   3. Tachycardia     Tests performed today include: Blood cell count and electrolytes were normal Liver function tests were elevated, suggestive of a viral infection Testing for COVID, flu, RSV, mononucleosis were negative Testing for hepatitis and malaria are not back yet Blood cultures were sent and pending Urine testing did not show any signs of infection CT scan of your chest, abdomen, pelvis all looked good and did not show any signs of a blood clot, infection in the lungs, infection in the abdomen Vital signs. See below for your results today.   Medications prescribed:  None  Take any prescribed medications only as directed.  Home care instructions:  Follow any educational materials contained in this packet.  Please continue to take over-the-counter medications for control of pain and fever and hydrate yourself well.  We recommended that you be admitted to the hospital today for further treatment given that your heart rate is very high despite treatment of your fever and treatment with IV fluids.  You have decided that you would like to go home, however if you change your mind you are welcome to return anytime.  Follow-up instructions: Please follow-up with your primary care provider in the next 3 days for further evaluation of your symptoms.   Return instructions:  Please return to the Emergency Department if you experience worsening symptoms.  Return with persistent vomiting, confusion, severe headache, severe neck pain, worsening symptoms Please return if you have any other emergent concerns.  Additional Information:  Your vital signs today were: BP 109/68   Pulse (!) 122   Temp 100.3 F (37.9 C) (Oral)   Resp (!) 21   SpO2 100%  If your blood pressure (BP) was elevated above 135/85 this visit, please have this repeated by your doctor  within one month. --------------

## 2022-11-02 NOTE — ED Triage Notes (Signed)
Patient presents to ED via POV. Here with chills, fatigue, diarrhea and nausea x2 days. Sent from urgent care due to tachycardia. HR 152 in triage.

## 2022-11-02 NOTE — ED Provider Notes (Signed)
[place] [facility]GENC[facility] DEPARTMENT AT [facility] Provider Note   CSN: [id number] Arrival date & time: 11/02/22  1255     History  Chief Complaint  Patient presents with   Chills    Brianna Avery is a 42 y.o. female.  Patient with history of diabetes on metformin --presents to the emergency department today for evaluation of fever, body aches, headache, diarrhea.  Symptoms started yesterday.  Patient lives in [place] but was on vacation in [place] and flew back yesterday, through [place].  She started with diarrhea and nausea.  No vomiting.  She had some upper abdominal cramping with this.  She developed a fever.  No cough but has had some shortness of breath.  She has had a headache without confusion or neck pain.  Patient went to urgent care today and had initial testing for flu, COVID which was negative.  She was referred to the ED for persistent tachycardia.  She was staying in [place], [place], was subsequently in [place] for 2 days and was asymptomatic, and then flew back to the [place].  No definite insect bites.  No known sick contacts.      Home Medications Prior to Admission medications   Not on File      Allergies    Patient has no known allergies.    Review of Systems   Review of Systems  Physical Exam Updated Vital Signs BP 138/88   Pulse (!) 146   Temp 100.3 F (37.9 C) (Oral)   Resp (!) 22   SpO2 100%   Physical Exam Vitals and nursing note reviewed.  Constitutional:      General: She is not in acute distress.    Appearance: She is well-developed.  HENT:     Head: Normocephalic and atraumatic.     Right Ear: External ear normal.     Left Ear: External ear normal.     Nose: Nose normal. No congestion or rhinorrhea.     Mouth/Throat:     Mouth: Mucous membranes are moist.  Eyes:     Conjunctiva/sclera: Conjunctivae normal.  Cardiovascular:     Rate and Rhythm: Regular rhythm. Tachycardia present.     Heart sounds: No murmur  heard. Pulmonary:     Effort: No respiratory distress.     Breath sounds: No wheezing, rhonchi or rales.  Abdominal:     Palpations: Abdomen is soft.     Tenderness: There is no abdominal tenderness. There is no guarding or rebound.  Musculoskeletal:     Cervical back: Normal range of motion and neck supple.     Right lower leg: No edema.     Left lower leg: No edema.  Skin:    General: Skin is warm and dry.     Findings: No rash.  Neurological:     General: No focal deficit present.     Mental Status: She is alert. Mental status is at baseline.     Motor: No weakness.  Psychiatric:        Mood and Affect: Mood normal.     ED Results / Procedures / Treatments   Labs (all labs ordered are listed, but only abnormal results are displayed) Labs Reviewed  CBC WITH DIFFERENTIAL/PLATELET - Abnormal; Notable for the following components:      Result Value   Hemoglobin 11.7 (*)    MCV 79.9 (*)    MCH 25.5 (*)    Lymphs Abs 0.3 (*)    All other components within  normal limits  COMPREHENSIVE METABOLIC PANEL - Abnormal; Notable for the following components:   Sodium 132 (*)    Potassium 3.4 (*)    Glucose, Bld 148 (*)    Calcium 8.4 (*)    AST 316 (*)    ALT 261 (*)    All other components within normal limits  CULTURE, BLOOD (ROUTINE X 2)  CULTURE, BLOOD (ROUTINE X 2)  RESP PANEL BY RT-PCR (RSV, FLU A&B, COVID)  RVPGX2  LACTIC ACID, PLASMA  LACTIC ACID, PLASMA  PROTIME-INR  APTT  PREGNANCY, URINE  TSH  PARASITE EXAM SCREEN, BLOOD-W CONF TO [facility] (NOT @ [facility])  URINALYSIS, W/ REFLEX TO CULTURE (INFECTION SUSPECTED)  MONONUCLEOSIS SCREEN  HEPATITIS PANEL, ACUTE  LIPASE, BLOOD  TROPONIN I (HIGH SENSITIVITY)    EKG EKG Interpretation Date/Time:  Friday November 02 2022 13:06:53 EDT Ventricular Rate:  138 PR Interval:  133 QRS Duration:  77 QT Interval:  307 QTC Calculation: 466 R Axis:   24  Text Interpretation: Sinus tachycardia Low voltage, precordial leads  Borderline T abnormalities, diffuse leads Confirmed by Fulton Reek [phone]) on 11/02/2022 1:19:13 PM  Radiology No results found.  Procedures Procedures    Medications Ordered in ED Medications  lactated ringers bolus 1,000 mL (1,000 mLs Intravenous New Bag/Given 11/02/22 1348)  lactated ringers bolus 1,000 mL (1,000 mLs Intravenous New Bag/Given 11/02/22 1345)  ondansetron (ZOFRAN) injection 4 mg (4 mg Intravenous Given 11/02/22 1356)    ED Course/ Medical Decision Making/ A&P    Patient seen and examined. History obtained directly from patient.   Labs/EKG: Ordered Sepsis work-up including PT/INR and APTT as part of sepsis protocol to evaluate for endorgan damage, added on respiratory viral panel, TSH.  Given recent travel, will check smear for malaria, hepatitis panel.  Imaging: Ordered chest x-ray.  Due to tachycardia and travel, abdominal pain and diarrhea, will screen for pneumonia and PE with CT of the chest and CT of the abdomen pelvis.  Medications/Fluids: Ordered: Lactated Ringer's 2 L, IV Zofran  Most recent vital signs reviewed and are as follows: BP 138/88   Pulse (!) 146   Temp 100.3 F (37.9 C) (Oral)   Resp (!) 22   SpO2 100%   Initial impression: Febrile illness, no obvious meningitis, positive travel history  2:01 PM Reassessment performed.  Called the room.  Dr. Earlene Plater present.  Shortly after receiving Zofran, patient with several minute retching spell.  She complained of shortness of breath but was not hypoxic.  Continues to be tachycardic.  This resolved, heart rate into the 140s.  Labs personally reviewed and interpreted including: CBC with normal white blood cell count, lymphopenia noted.  Reviewed pertinent lab work and imaging with patient at bedside. Questions answered.   Most current vital signs reviewed and are as follows: BP 138/88   Pulse (!) 146   Temp 100.3 F (37.9 C) (Oral)   Resp (!) 22   SpO2 100%   Plan: Continue close monitoring,  added Monospot.  2:28 PM Continuing to have severe nausea and chills.  She needs an antipyretic.  LFTs are elevated, however not tolerating p.o.'s.  Due to this, will check rectal temp and give 1 dose of p.o. Tylenol until nausea and vomiting can be better controlled.  Discussed with Dr. Earlene Plater.   2:51 PM reviewed CXR.   3:08 PM Reassessment performed. Patient appears stable.  Complaining of chills.  Heart rate still in the 130s, completing fluids.  Updated husband who is now at  bedside.  Labs personally reviewed and interpreted including: CMP mild hyponatremia at 132, mild hypokalemia at 3.4 with normal chloride, glucose 148 with normal anion gap, transaminitis noted with normal bilirubin; troponin normal at 2; respiratory panel negative; INR normal; APTT normal; pregnancy negative; UA without signs of infection; Monospot negative.  Imaging personally visualized and interpreted including: CT chest/abdomen/pelvis pending.   Reviewed pertinent lab work and imaging with patient at bedside. Questions answered.   Most current vital signs reviewed and are as follows: BP 105/79   Pulse (!) 130   Temp (!) 103.2 F (39.6 C) (Rectal)   Resp 20   SpO2 100%   Plan: Reassess  5:43 PM Reassessment performed. Patient appears stable.  She states that she is feeling better.  Nausea is improved.  Heart rate more recently 118-130.  I spoken with Dr. Luciana Axe, of infectious disease.  Agrees that lab workup to this point appears most likely viral, however would consider giving a dose of Rocephin and consider typhoid fever on diagnosis.  This would likely show up in routine blood cultures.  I recommended the patient that she be admitted for continued fluids and monitoring given persistent tachycardia up to 130.  Patient is stating now that she feels better and is adamant about not being admitted to the hospital.  Her husband is at bedside and states that he is comfortable with this and that if she gets worse they  can bring her directly back to the hospital for admission.  I discussed my concerns including her abnormal vital signs and that we are not certain what the underlying causes at this time.  I asked if she would be agreeable to [place] giving her dose of antibiotics and awaiting the CT results and she is.  She is agreeable to signing out AGAINST MEDICAL ADVICE once this is completed.  Patient appears to have capacity and is not confused.  Labs personally reviewed and interpreted including: Lactate improved from 2.1-1.1.  Still waiting hepatitis panel.  Imaging personally visualized and interpreted including: CT of the chest, abdomen and pelvis.  Awaiting radiology read.  I do not see any acute findings such as a large PE or severe intra-abdominal process, but waiting official read.  Reviewed pertinent lab work and imaging with patient at bedside. Questions answered.   Most current vital signs reviewed and are as follows: BP 129/69   Pulse (!) 131   Temp (!) 103.1 F (39.5 C) (Oral)   Resp (!) 23   SpO2 98%   Plan: IV Rocephin, CT read, likely discharge AMA.  I discussed with Dr. Jearld Fenton.   7:04 PM Reassessment performed. Patient appears stable.  Heart rate is now 110-115.  She continues to want to go home.  She has received IV Rocephin.  Most current vital signs reviewed and are as follows: BP 109/68   Pulse (!) 122   Temp 100.3 F (37.9 C) (Oral)   Resp (!) 21   SpO2 100%   Plan: Discharge AMA.  Encouraged PCP follow-up on Monday for recheck of labs and symptoms.  I have encouraged her to return with any worsening symptoms or if she changes her mind about further treatment and evaluation.  Encouraged use of over-the-counter medications for symptom control and maintaining good hydration at home.                                Medical Decision Making Amount and/or Complexity  of Data Reviewed Labs: ordered. Radiology: ordered.  Risk OTC drugs. Prescription drug  management.   Patient with high fever and vomiting on arrival with recent travel to [place].  She was also in [place] for a brief period.  She has been treated in the emergency department with IV fluids and antipyretics with some improvement in heart rate, however remains persistently tachycardic.  We recommended admission to the hospital for observation and continued treatment.  Patient declines.  She has had extensive workup.  Her liver function testing is elevated, most likely related to a viral infection of some sort.  Hepatitis and malaria testing pending.  I did touch base with infectious disease who recommended a dose of Rocephin.  Patient appears to have capacity to make medical decisions.  Her family member is at bedside who is comfortable with monitoring her at home.  They do seem reliable to return if symptoms get worse.  I have low concern for meningitis at this time.  Workup does not reveal pneumonia or UTI.  Respiratory viral panel testing is negative.         Final Clinical Impression(s) / ED Diagnoses Final diagnoses:  Febrile illness, acute  Transaminitis  Tachycardia    Rx / DC Orders ED Discharge Orders     None         Renne Crigler, PA-C 11/02/22 1907    Laurence Spates, MD 11/05/22 1451

## 2022-11-07 LAB — CULTURE, BLOOD (ROUTINE X 2)
Culture: NO GROWTH
Culture: NO GROWTH
Special Requests: ADEQUATE
Special Requests: ADEQUATE

## 2022-11-14 LAB — PARASITE EXAM, BLOOD
